# Patient Record
Sex: Male | Born: 1995 | Race: White | Hispanic: No | Marital: Single | Smoking: Never smoker
Health system: Southern US, Community
[De-identification: ages and names within clinical notes are randomized; demographics above are authoritative.]

## PROBLEM LIST (undated history)

## (undated) DIAGNOSIS — T7840XA Allergy, unspecified, initial encounter: Secondary | ICD-10-CM

## (undated) HISTORY — DX: Allergy, unspecified, initial encounter: T78.40XA

---
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
## 2017-09-29 ENCOUNTER — Ambulatory Visit
Admission: EM | Admit: 2017-09-29 | Discharge: 2017-09-29 | Disposition: A | Discharge status: Home / Self Care | Payer: BLUE CROSS/BLUE SHIELD | Attending: Internal Medicine | Admitting: Internal Medicine

## 2017-09-29 DIAGNOSIS — Z23 Encounter for immunization: Secondary | ICD-10-CM

## 2017-09-29 DIAGNOSIS — W268XXA Contact with other sharp object(s), not elsewhere classified, initial encounter: Secondary | ICD-10-CM

## 2017-09-29 DIAGNOSIS — S61011A Laceration without foreign body of right thumb without damage to nail, initial encounter: Secondary | ICD-10-CM | POA: Diagnosis not present

## 2017-09-29 MED ORDER — MUPIROCIN 2 % EX OINT: 1.0000 "application " | TOPICAL_OINTMENT | Freq: Three times a day (TID) | CUTANEOUS | 0 refills | Status: AC

## 2017-09-29 MED ORDER — TETANUS-DIPHTH-ACELL PERTUSSIS 5-2.5-18.5 LF-MCG/0.5 IM SUSP
0.5000 mL | Freq: Once | INTRAMUSCULAR | Status: AC
  Administered 2017-09-29: 0.5 mL via INTRAMUSCULAR

## 2017-09-29 NOTE — ED Notes (Signed)
Pt in treatment room.  In NAD.  Needs address.  Pt/Fam updated on POC.   

## 2017-09-29 NOTE — ED Triage Notes (Signed)
Was at work when opening a can of food.  Cut right thumb on lid.   No bleeding currently.  Happened approx 1230.   Last tetanus unknown.

## 2017-09-29 NOTE — Discharge Instructions (Signed)
Elevate your hand above the level of your heart tonight for swelling and pain control.  Take Motrin or ibuprofen 400 mg in conjunction with Tylenol 500 mg for pain control.  Keep the area dry until tomorrow.  Then start applying Bactroban ointment 3 times daily to the wound until the  sutures removed in 10 days.

## 2017-09-29 NOTE — ED Provider Notes (Addendum)
[facility]    CSN: [id number] Arrival date & time: 09/29/17  1937     History   Chief Complaint Chief Complaint  Patient presents with  . Laceration    HPI Lance Weiss is a 22 y.o. male.   HPI  22 year old male presents today with a laceration to his right nondominant thumb.  He was at work today opening a can of food when he inadvertently lacerated his right thumb had on the lid.  Happened approximately 1230 today.Not  Current on his tetanus.        Past Medical History:  Diagnosis Date  . Allergy     Patient Active Problem List   Diagnosis Date Noted  . 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 ELEVATED BLOOD PRESSURE 01/11/2008  . DERMATITIS, CONTACT, NOS 10/25/2006    Past Surgical History:  Procedure Laterality Date  . TONSILECTOMY, ADENOIDECTOMY, BILATERAL MYRINGOTOMY AND TUBES  12/1995   [facility] Medications    Prior to Admission medications   Medication Sig Start Date End Date Taking? Authorizing Provider  fexofenadine (ALLEGRA) 180 MG tablet Take 180 mg by mouth daily.    [provider]  mupirocin ointment (BACTROBAN) 2 % Apply 1 application topically 3 (three) times daily. 09/29/17   Lutricia Feil, PA-C    Family History History reviewed. No pertinent family history.  Social History Social History   Tobacco Use  . Smoking status: Never Smoker  . Smokeless tobacco: Never Used  Substance Use Topics  . Alcohol use: No  . Drug use: No     Allergies   Patient has no known allergies.   Review of Systems Review of Systems  Constitutional: Negative for activity change, appetite change, chills, fatigue and fever.  Skin: Positive for wound.  All other systems reviewed and are negative.    Physical Exam Triage Vital Signs ED Triage Vitals  Enc Vitals Group     BP 09/29/17 1947 (!) 161/107     Pulse Rate 09/29/17 1947 95     Resp 09/29/17 1947 16     Temp 09/29/17 1947 98.9 F (37.2 C)     Temp  Source 09/29/17 1947 Oral     SpO2 09/29/17 1947 100 %     Weight --      Height --      Head Circumference --      Peak Flow --      Pain Score 09/29/17 1949 5     Pain Loc --      Pain Edu? --      Excl. in GC? --    No data found.  Updated Vital Signs BP (!) 161/107 (BP Location: Left Arm)   Pulse 95   Temp 98.9 F (37.2 C) (Oral)   Resp 16   SpO2 100%   Visual Acuity Right Eye Distance:   Left Eye Distance:   Bilateral Distance:    Right Eye Near:   Left Eye Near:    Bilateral Near:     Physical Exam  Constitutional: He is oriented to person, place, and time. He appears well-developed and well-nourished. No distress.  HENT:  Head: Normocephalic.  Eyes: Pupils are equal, round, and reactive to light. Right eye exhibits no discharge. Left eye exhibits no discharge.  Neck: Normal range of motion.  Musculoskeletal: Normal range of motion. He exhibits tenderness.  Neurological: He is alert and oriented to person, place, and time.  Skin: Skin is  warm and dry. He is not diaphoretic.  Examination of the right nondominant thumb distal pad shows a 2 cm transverse laceration. It carries down to subcutaneous tissue.  Patient's FDS and FDP are strong and intact.  He has no numbness to palpation distally.  Psychiatric: He has a normal mood and affect. His behavior is normal. Judgment and thought content normal.  Nursing note and vitals reviewed.    UC Treatments / Results  Labs (all labs ordered are listed, but only abnormal results are displayed) Labs Reviewed - No data to display  EKG None  Radiology No results found.  Procedures Laceration Repair Date/Time: 09/29/2017 8:30 PM Performed by: Lutricia Feil, PA-C Authorized by: Isa Rankin, MD   Consent:    Consent obtained:  Verbal   Consent given by:  Patient   Risks discussed:  Infection and pain Anesthesia (see MAR for exact dosages):    Anesthesia method:  Local infiltration   Local  anesthetic:  Lidocaine 1% w/o epi Laceration details:    Location:  Finger   Finger location:  R thumb   Length (cm):  2   Depth (mm):  2 Repair type:    Repair type:  Simple Pre-procedure details:    Preparation:  Patient was prepped and draped in usual sterile fashion Exploration:    Hemostasis achieved with:  Direct pressure   Wound exploration: wound explored through full range of motion and entire depth of wound probed and visualized     Contaminated: no   Treatment:    Area cleansed with:  Betadine   Amount of cleaning:  Standard   Irrigation solution:  Sterile saline   Irrigation volume:  60   Irrigation method:  Pressure wash   Visualized foreign bodies/material removed: no   Skin repair:    Repair method:  Sutures   Suture size:  5-0   Suture material:  Prolene   Suture technique:  Simple interrupted   Number of sutures:  6 Approximation:    Approximation:  Close Post-procedure details:    Dressing:  Antibiotic ointment, non-adherent dressing and splint for protection   (including critical care time)  Medications Ordered in UC Medications  Tdap (BOOSTRIX) injection 0.5 mL (0.5 mLs Intramuscular Given 09/29/17 1956)    Initial Impression / Assessment and Plan / UC Course  I have reviewed the triage vital signs and the nursing notes.  Pertinent labs & imaging results that were available during my care of the patient were reviewed by me and considered in my medical decision making (see chart for details).     Discussed with the patient regarding his laceration.  Keep dry until tomorrow.  Then he will start a washing program 3 times daily and applying Bactroban ointment to the area until the sutures are removed in 10 days.  Tonight I have advised him to elevate it above his heart to control the swelling or any pain.  Provide him with a stack splint to protect the area when he is working.  Discussed in detail signs and symptoms of infection and he will return if any  these occur. Final Clinical Impressions(s) / UC Diagnoses   Final diagnoses:  Laceration of right thumb without foreign body without damage to nail, initial encounter     Discharge Instructions     Elevate your hand above the level of your heart tonight for swelling and pain control.  Take Motrin or ibuprofen 400 mg in conjunction with Tylenol 500 mg for pain  control.  Keep the area dry until tomorrow.  Then start applying Bactroban ointment 3 times daily to the wound until the  sutures removed in 10 days.    ED Prescriptions    Medication Sig Dispense Auth. Provider   mupirocin ointment (BACTROBAN) 2 % Apply 1 application topically 3 (three) times daily. 22 g Lutricia Feil, PA-C     Controlled Substance Prescriptions [place] Controlled Substance Registry consulted? Not Applicable   Lutricia Feil, PA-C 09/29/17 2050    Lutricia Feil, PA-C 09/29/17 2051
